# Patient Record
Sex: Male | Born: 1970 | Race: Black or African American | Hispanic: No | Marital: Married | Smoking: Current some day smoker
Health system: Southern US, Community
[De-identification: ages and names within clinical notes are randomized; demographics above are authoritative.]

## PROBLEM LIST (undated history)

## (undated) DIAGNOSIS — Z889 Allergy status to unspecified drugs, medicaments and biological substances status: Secondary | ICD-10-CM

## (undated) HISTORY — PX: NO PAST SURGERIES: SHX2092

---

## 2004-07-24 ENCOUNTER — Ambulatory Visit: Payer: Self-pay | Admitting: General Surgery

## 2007-08-24 ENCOUNTER — Emergency Department: Payer: Self-pay | Admitting: Emergency Medicine

## 2008-11-05 ENCOUNTER — Emergency Department: Payer: Self-pay | Admitting: Emergency Medicine

## 2011-11-16 ENCOUNTER — Ambulatory Visit: Payer: Self-pay | Admitting: Internal Medicine

## 2013-06-29 ENCOUNTER — Ambulatory Visit: Payer: Self-pay | Admitting: Emergency Medicine

## 2013-06-29 LAB — URINALYSIS, COMPLETE
Bacteria: NEGATIVE
Blood: NEGATIVE
Glucose,UR: NEGATIVE mg/dL (ref 0–75)
Ketone: NEGATIVE
Leukocyte Esterase: NEGATIVE
RBC,UR: NONE SEEN /HPF (ref 0–5)

## 2014-03-05 IMAGING — CR DG SHOULDER 3+V*R*
1 series · 3 of 3 positions shown · non-contrast
Comparison: none

REASON FOR EXAM: limited ROM
COMMENTS:

PROCEDURE:     MDR - MDR SHOULDER RIGHT COMPLETE  - November 16, 2011 [DATE]
RESULT:     Images of the right shoulder demonstrate good range of motion
with internal and external rotation without evidence of fracture or
dislocation.

[Series 1: internal rotate · 0.17mm/px · 3 of 3 slices shown]
[im 1/3]
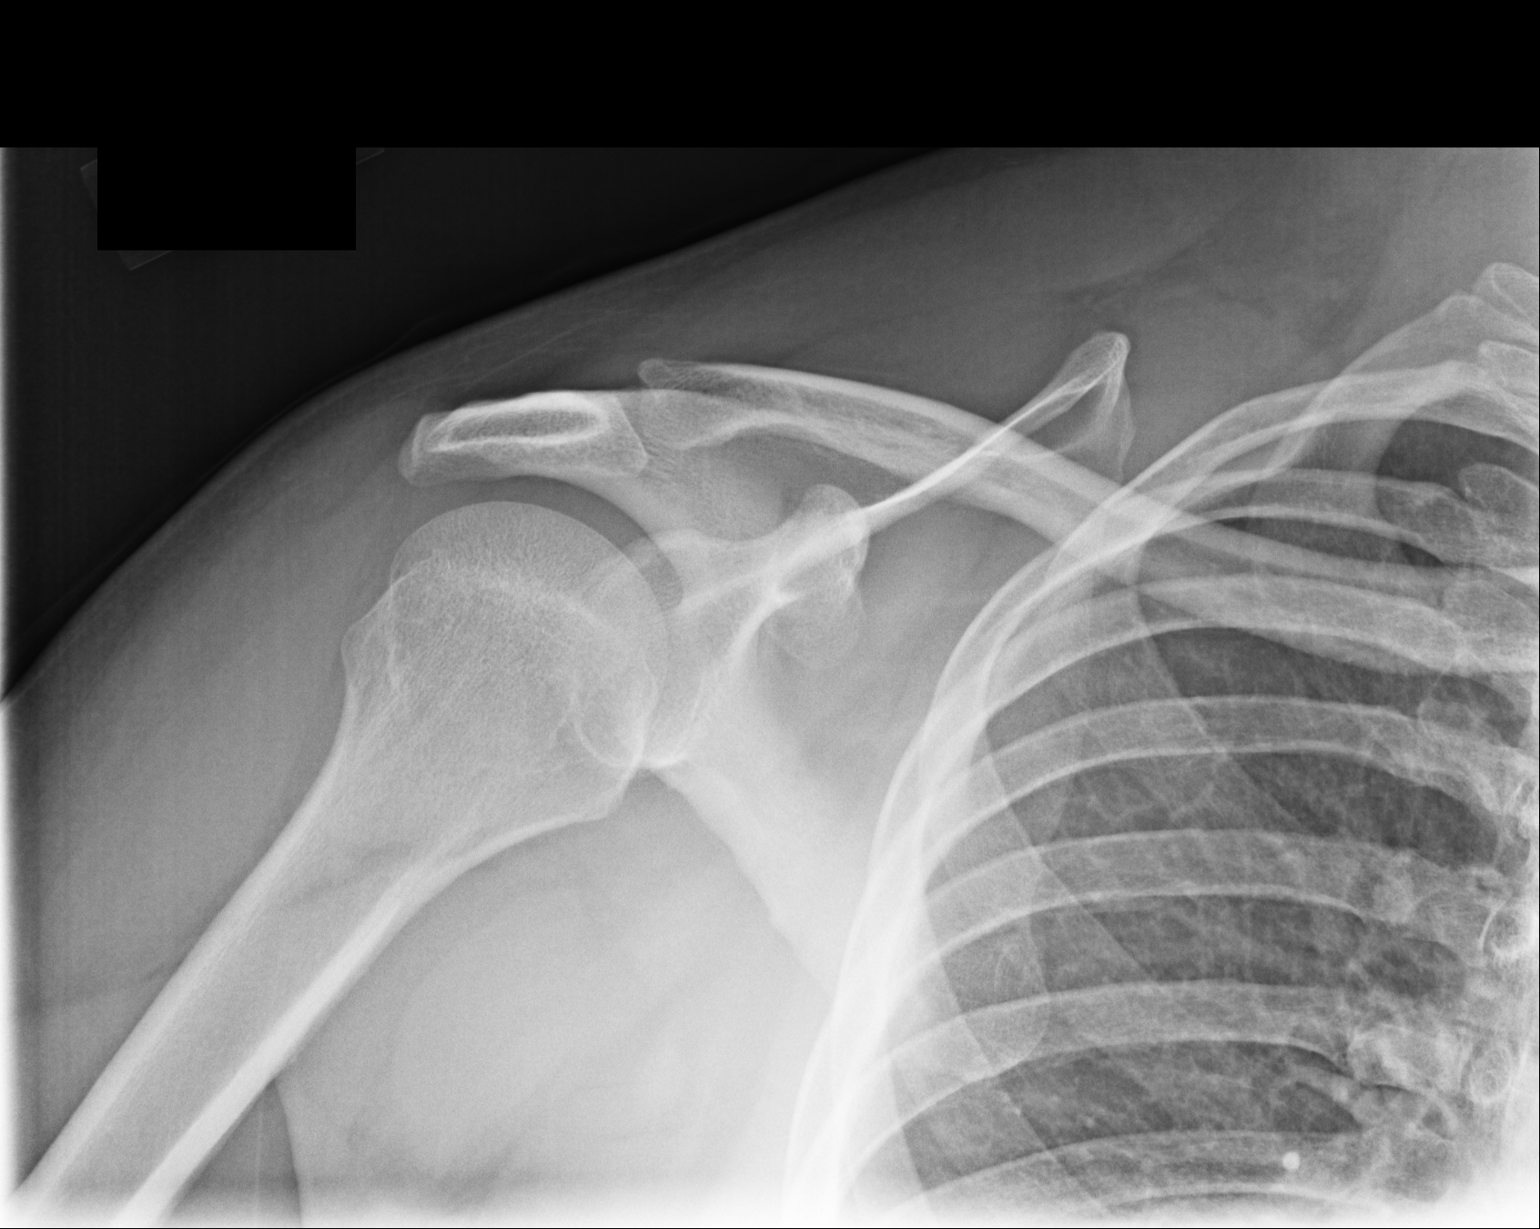
[im 2/3]
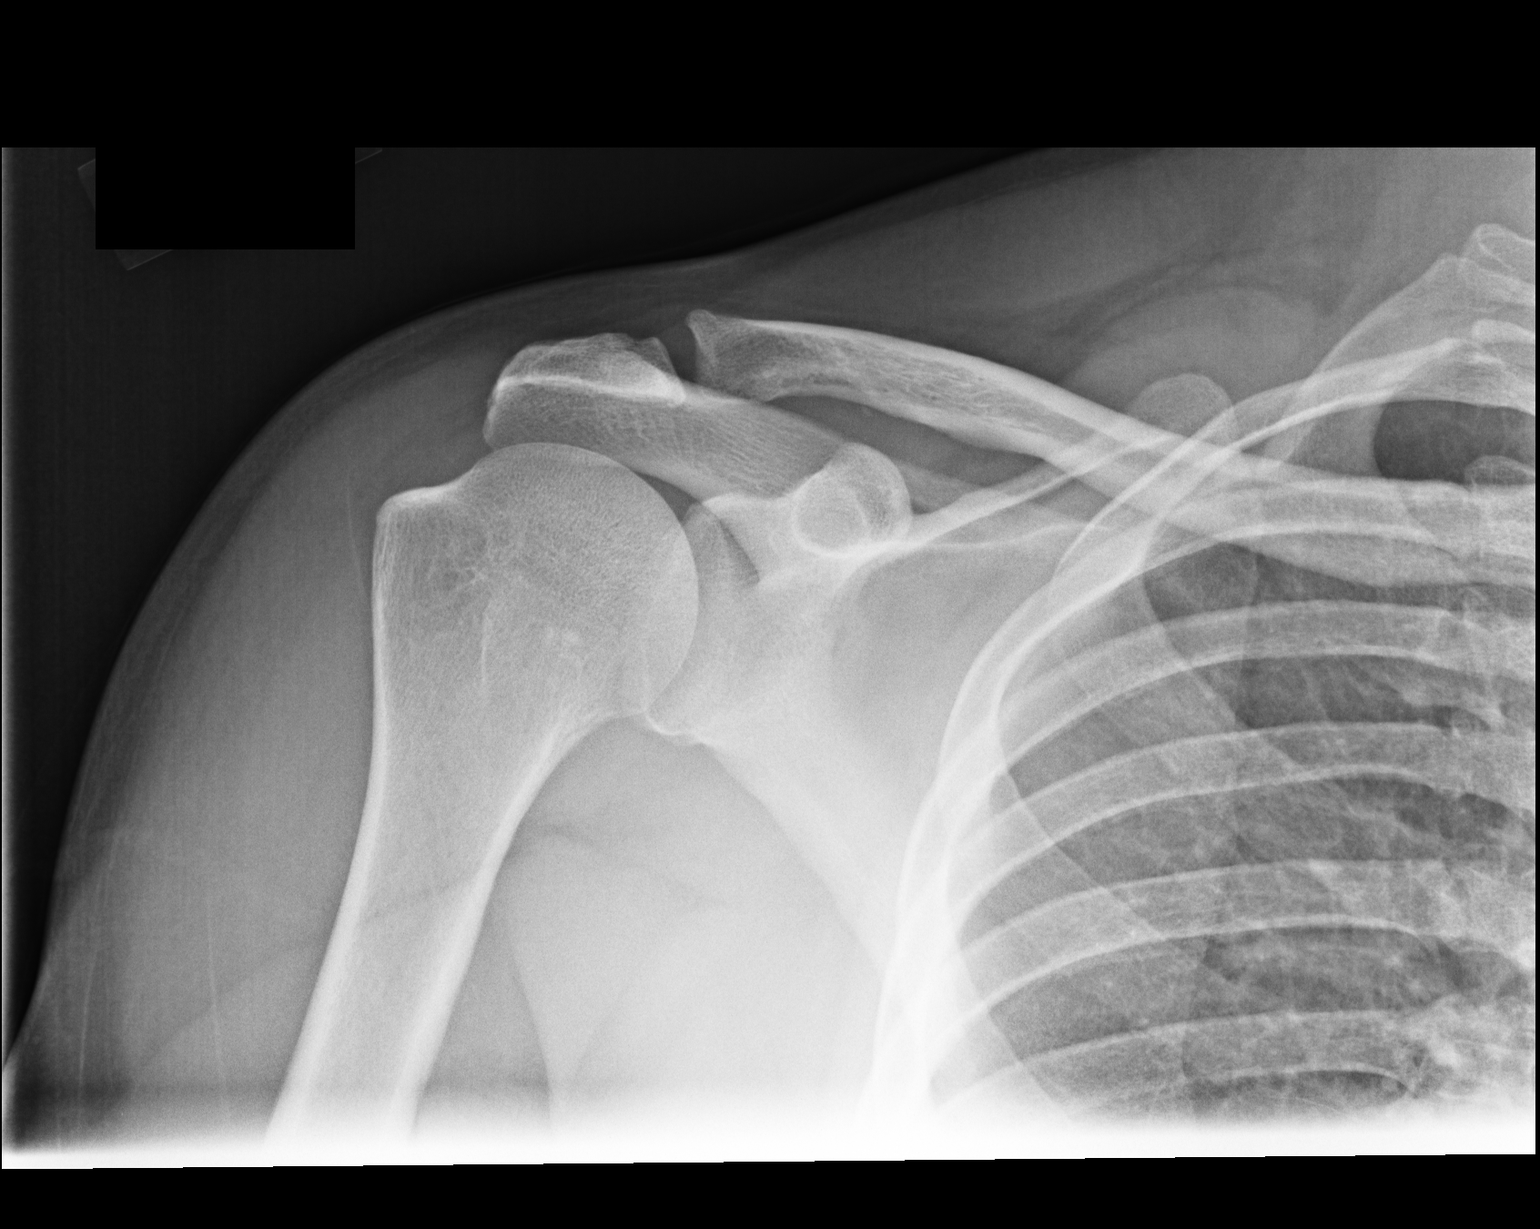
[im 3/3]
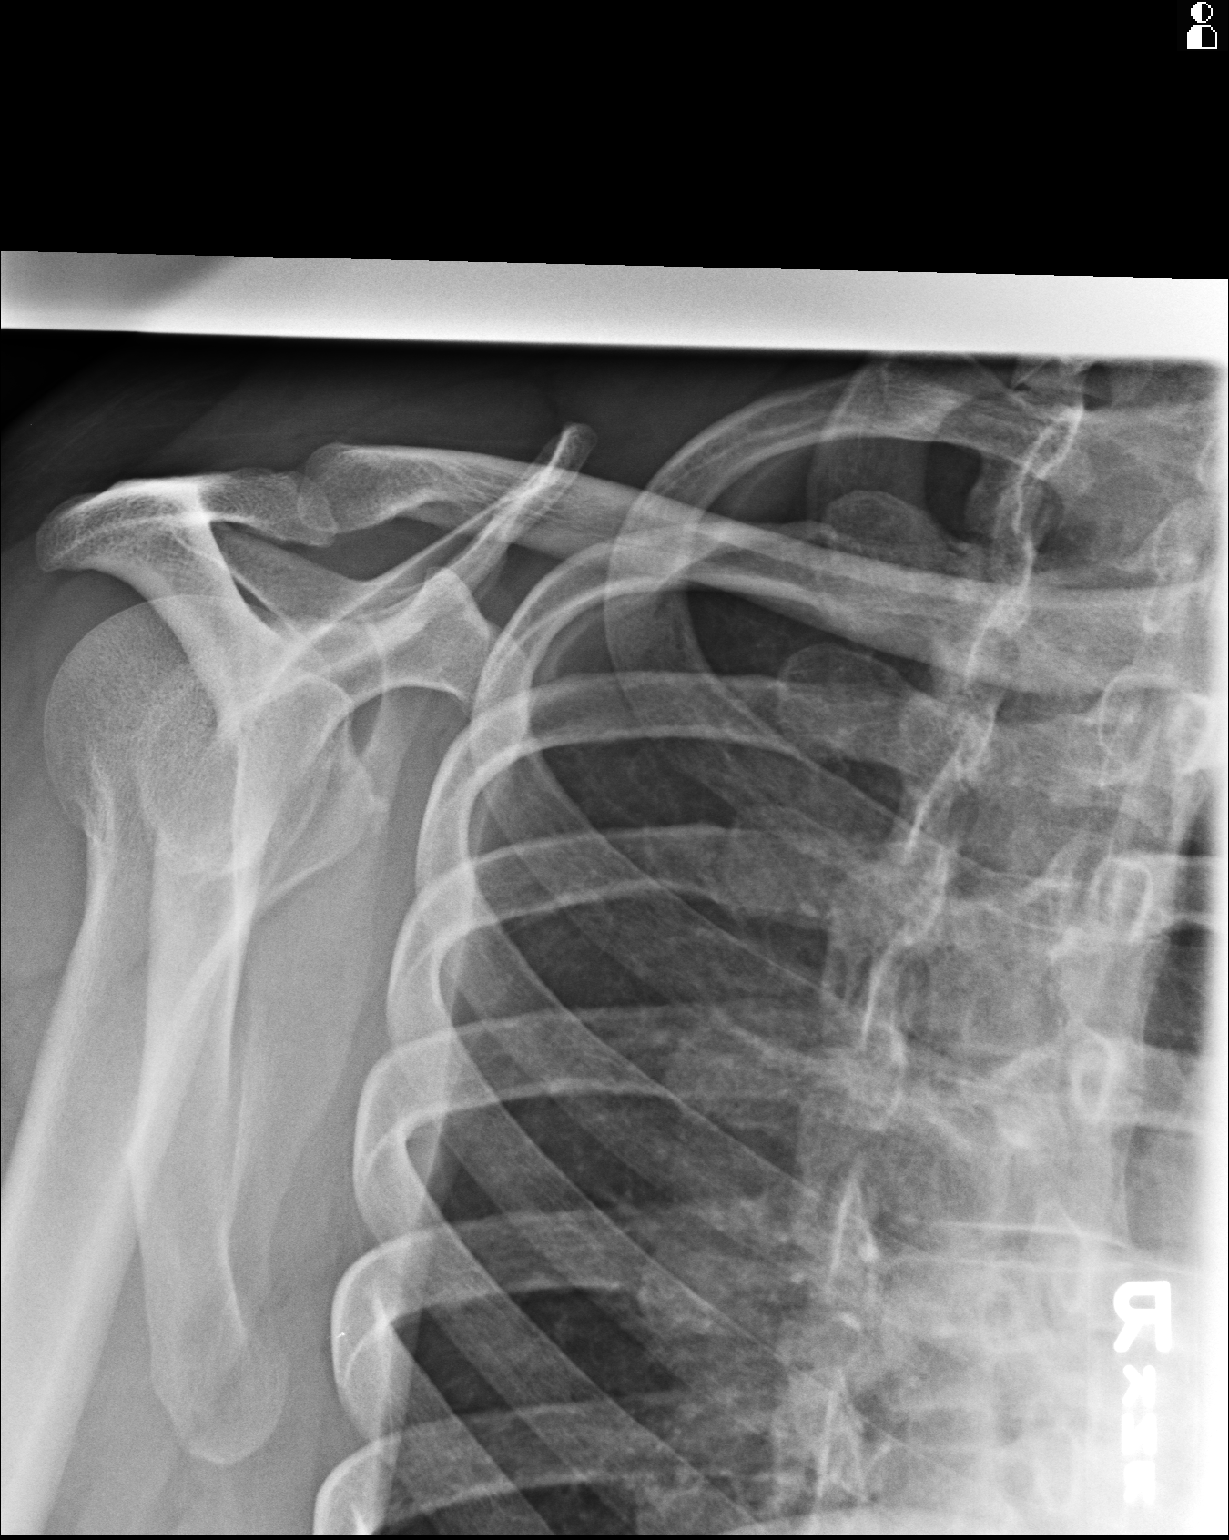

[3 of 3 positions shown; findings below may reference images not displayed]

IMPRESSION: 1.     No acute bony abnormality evident in the right shoulder.

## 2014-05-21 ENCOUNTER — Ambulatory Visit: Payer: Self-pay | Admitting: Emergency Medicine

## 2014-05-23 ENCOUNTER — Ambulatory Visit: Payer: Self-pay

## 2014-05-24 ENCOUNTER — Ambulatory Visit: Payer: Self-pay | Admitting: Emergency Medicine

## 2014-05-26 ENCOUNTER — Ambulatory Visit: Payer: Self-pay | Admitting: Physician Assistant

## 2016-09-01 ENCOUNTER — Ambulatory Visit
Admission: EM | Admit: 2016-09-01 | Discharge: 2016-09-01 | Disposition: A | Discharge status: Home / Self Care | Payer: 59 | Attending: Emergency Medicine | Admitting: Emergency Medicine

## 2016-09-01 ENCOUNTER — Encounter: Payer: Self-pay | Admitting: Emergency Medicine

## 2016-09-01 DIAGNOSIS — J029 Acute pharyngitis, unspecified: Secondary | ICD-10-CM | POA: Diagnosis not present

## 2016-09-01 MED ORDER — PREDNISONE 20 MG PO TABS: 40.0000 mg | ORAL_TABLET | Freq: Every day | ORAL | 0 refills | Status: AC

## 2016-09-01 MED ORDER — IPRATROPIUM BROMIDE 0.06 % NA SOLN: 2.0000 | Freq: Four times a day (QID) | NASAL | 0 refills | Status: DC

## 2016-09-01 MED ORDER — IBUPROFEN 800 MG PO TABS: 800.0000 mg | ORAL_TABLET | Freq: Three times a day (TID) | ORAL | 0 refills | Status: DC

## 2016-09-01 NOTE — ED Triage Notes (Signed)
Patient states he ate 2 hot dogs a couple of days ago and that night his throat started swelling.  It is stil swollen and sore

## 2016-09-01 NOTE — Discharge Instructions (Signed)
1 gram of Tylenol and the ibuprofen together 3 times a day as needed for pain.  Make sure you drink plenty of extra fluids.  Some people find salt water gargles and  Traditional Medic[employer]'s "Throat Coat" tea helpful. Take 5 mL of liquid Benadryl and 5 mL of Maalox. Mix it together, and then hold it in your mouth for as long as you can and then swallow. You may do this 4 times a day.  Start Mucinex D, stop the Zyrtec for the time being.  Go to www.goodrx.com to look up your medications. This will give you a list of where you can find your prescriptions at the most affordable prices.

## 2016-09-01 NOTE — ED Provider Notes (Signed)
HPI  SUBJECTIVE:  Patient reports sore throat starting 3-4 days ago starting approximately an hour after eating several hot dogs. He states pain is constant and then it feels "sore". He reports a cough at night. He states that his symptoms are worse at nighttime but is not associated with lying down, he took 20 mg prednisone which significantly helped his symptoms. He has also tried salt water gargles. He states that he had similar symptoms before after eating some tuna several years ago and no longer eats tuna for this reason.   no Fever  + Swollen neck glands    No nasal congestion, rhinorrhea, postnasal drip No Myalgias No Headache No Rash     No Recent Strep or mono Exposure No Abdominal Pain No reflux sxs No Allergy sxs although he has a history of allergies. States that they are not bothering him at this time  No Breathing difficulty, voice changes or sensation of throat swelling shut  No Drooling No Trismus No abx in past month. .  No antipyretic in past 4-6 hrs  Past medical history of seasonal allergies. No history of GERD, mono, strep, diabetes, hypertension, food allergies. [facility]   History reviewed. No pertinent past medical history.  History reviewed. No pertinent surgical history.  Family History  Problem Relation Age of Onset  . Hypertension Mother   . Cancer Father     Social History  Substance Use Topics  . Smoking status: Never Smoker  . Smokeless tobacco: Never Used  . Alcohol use Yes    No current facility-administered medications for this encounter.   Current Outpatient Prescriptions:  .  cetirizine (ZYRTEC) 10 MG tablet, Take 10 mg by mouth daily., Disp: , Rfl:   Allergies  Allergen Reactions  . Tuna [Fish Allergy] Swelling     ROS  As noted in HPI.   Physical Exam  BP 133/76 (BP Location: Left Arm)   Pulse 66   Temp 97.5 F (36.4 C)   Resp 18   Ht 6\' 1"  (1.854 m)   Wt 239 lb 14.4 oz (108.8 kg)   SpO2 98%   BMI  31.65 kg/m   Constitutional: Well developed, well nourished, no acute distress Eyes:  EOMI, conjunctiva normal bilaterally HENT: Normocephalic, atraumatic,mucus membranes moist. +  nasal congestion + erythematous oropharynx - enlarged tonsils - exudates. Uvula midline. Positive postnasal drip Respiratory: Normal inspiratory effort Cardiovascular: Normal rate, no murmurs, rubs, gallops GI: nondistended, nontender. No appreciable splenomegaly skin: No rash, skin intact Lymph:  + shotty cervical LN  Musculoskeletal: no deformities Neurologic: Alert & oriented x 3, no focal neuro deficits Psychiatric: Speech and behavior appropriate. At baseline mental status per caregiver.   ED Course   Medications - No data to display  No orders of the defined types were placed in this encounter.   No results found for this or any previous visit (from the past 24 hour(s)). No results found.  ED Clinical Impression  Sore throat  ED Assessment/Plan  Seriously doubt strep throat, so a rapid strep was not done. Presentation most consistent with viral pharyngitis versus acid reflux versus a food allergy. There is no evidence of airway compromise. Doubt retropharyngeal abscess or peritonsillar abscess.  Plan to send home with saline nasal irrigation, and Atrovent nasal spray, Mucinex D, Benadryl/Maalox mixture ibuprofen 800 mg with 1 g of Tylenol 3 times a day. Since the prednisone helped his symptoms significantly, we'll send home with 40 mg prednisone for the next 5 days.  Follow Up with PMD as needed.  Discussed MDM, plan and followup with patient . Discussed sn/sx that should prompt return to the  ED. Patient  agrees with plan.   Meds ordered this encounter  Medications  . cetirizine (ZYRTEC) 10 MG tablet    Sig: Take 10 mg by mouth daily.     *This clinic note was created using Dragon dictation software. Therefore, there may be occasional mistakes despite careful proofreading.    Domenick GongAshley  Amelya Mabry, MD 09/01/16 [phone]

## 2016-09-01 NOTE — ED Provider Notes (Signed)
HPI  SUBJECTIVE:  Patient reports sore throat starting 3-4 days ago starting approximately an hour after eating several hot dogs. He states pain is constant and then it feels "sore". He reports a cough at night. He states that his symptoms are worse at nighttime but is not associated with lying down, he took 20 mg prednisone which significantly helped his symptoms. He has also tried salt water gargles. He states that he had similar symptoms before after eating some tuna several years ago and no longer eats tuna for this reason.   no Fever  + Swollen neck glands                         No nasal congestion, rhinorrhea, postnasal drip No Myalgias No Headache No Rash                           No Recent Strep or mono Exposure No Abdominal Pain No reflux sxs No Allergy sxs although he has a history of allergies. States that they are not bothering him at this time  No Breathing difficulty, voice changes or sensation of throat swelling shut  No Drooling No Trismus No abx in past month. .  No antipyretic in past 4-6 hrs  Past medical history of seasonal allergies. No history of GERD, mono, stroke, diabetes, hypertension, food allergies. [facility]   History reviewed. No pertinent past medical history.  History reviewed. No pertinent surgical history.       Family History  Problem Relation Age of Onset  . Hypertension Mother   . Cancer Father         Social History  Substance Use Topics  . Smoking status: Never Smoker  . Smokeless tobacco: Never Used  . Alcohol use Yes    No current facility-administered medications for this encounter.   Current Outpatient Prescriptions:  .  cetirizine (ZYRTEC) 10 MG tablet, Take 10 mg by mouth daily., Disp: , Rfl:       Allergies  Allergen Reactions  . Tuna [Fish Allergy] Swelling     ROS  As noted in HPI.   Physical Exam  BP 133/76 (BP Location: Left Arm)   Pulse 66   Temp 97.5 F (36.4 C)    Resp 18   Ht 6\' 1"  (1.854 m)   Wt 239 lb 14.4 oz (108.8 kg)   SpO2 98%   BMI 31.65 kg/m   Constitutional: Well developed, well nourished, no acute distress Eyes:  EOMI, conjunctiva normal bilaterally HENT: Normocephalic, atraumatic,mucus membranes moist. +  nasal congestion + erythematous oropharynx - enlarged tonsils - exudates. Uvula midline. Positive postnasal drip Respiratory: Normal inspiratory effort Cardiovascular: Normal rate, no murmurs, rubs, gallops GI: nondistended, nontender. No appreciable splenomegaly skin: No rash, skin intact Lymph:  + shotty cervical LN  Musculoskeletal: no deformities Neurologic: Alert & oriented x 3, no focal neuro deficits Psychiatric: Speech and behavior appropriate. At baseline mental status per caregiver.   ED Course   Medications - No data to display  No orders of the defined types were placed in this encounter.   Lab Results Last 24 Hours  No results found for this or any previous visit (from the past 24 hour(s)).   Imaging Results (Last 48 hours)  No results found.    ED Clinical Impression  Sore throat  ED Assessment/Plan  Centor score 1/4 due to the shotty lymphadenopathy. Seriously doubt strep throat, so  a rapid strep was not done. Presentation most consistent with viral pharyngitis versus acid reflux versus a food allergy. There is no evidence of airway compromise. Doubt retropharyngeal abscess or peritonsillar abscess.  Plan to send home with saline nasal irrigation, and Atrovent nasal spray, d/c zyrtec start Mucinex D, Benadryl/Maalox mixture ibuprofen 800 mg with 1 g of Tylenol 3 times a day. Since the prednisone helped his symptoms significantly, we'll send home with 40 mg prednisone for the next 5 days. Follow Up with PMD as needed.  Discussed MDM, plan and followup with patient . Discussed sn/sx that should prompt return to the  ED. Patient  agrees with plan.       Meds ordered this encounter   Medications  . cetirizine (ZYRTEC) 10 MG tablet    Sig: Take 10 mg by mouth daily.     *This clinic note was created using Dragon dictation software. Therefore, there may be occasional mistakes despite careful proofreading.      Domenick GongAshley Kaiesha Tonner, MD 09/01/16 [phone]

## 2017-08-19 ENCOUNTER — Ambulatory Visit
Admission: EM | Admit: 2017-08-19 | Discharge: 2017-08-19 | Disposition: A | Discharge status: Home / Self Care | Payer: 59 | Attending: Family Medicine | Admitting: Family Medicine

## 2017-08-19 ENCOUNTER — Other Ambulatory Visit: Payer: Self-pay

## 2017-08-19 DIAGNOSIS — B358 Other dermatophytoses: Secondary | ICD-10-CM | POA: Diagnosis not present

## 2017-08-19 HISTORY — DX: Allergy status to unspecified drugs, medicaments and biological substances: Z88.9

## 2017-08-19 MED ORDER — MICONAZOLE NITRATE 2 % EX CREA: 1.0000 "application " | TOPICAL_CREAM | Freq: Two times a day (BID) | CUTANEOUS | 0 refills | Status: AC

## 2017-08-19 NOTE — Discharge Instructions (Signed)
Keep area dry and clean.

## 2017-08-19 NOTE — ED Provider Notes (Signed)
[facility]    CSN: [id number] Arrival date & time: 08/19/17  [id number]     History   Chief Complaint Chief Complaint  Patient presents with  . Skin Problem    HPI Luis Cabrera is a 46 y.o. male.   The history is provided by the patient.  Rash  Location:  Face and head/neck (round , patchy, scaly, erythematous, raised borders and pruritic rash to left cheek and back of head.) Head/neck rash location:  Scalp Facial rash location:  L cheek Severity:  Moderate Duration:  4 weeks Timing:  Constant Progression:  Worsening Chronicity:  New Relieved by:  Nothing   Past Medical History:  Diagnosis Date  . H/O seasonal allergies     There are no active problems to display for this patient.   History reviewed. No pertinent surgical history.     Home Medications    Prior to Admission medications   Medication Sig Start Date End Date Taking? Authorizing Provider  ibuprofen (ADVIL,MOTRIN) 800 MG tablet Take 1 tablet (800 mg total) by mouth 3 (three) times daily. 09/01/16   Domenick GongMortenson, Ashley, MD  ipratropium (ATROVENT) 0.06 % nasal spray Place 2 sprays into both nostrils 4 (four) times daily. 3-4 times/ day 09/01/16   Domenick GongMortenson, Ashley, MD  miconazole (MICOTIN) 2 % cream Apply 1 application topically 2 (two) times daily for 28 days. 08/19/17 09/16/17  Chasty Randal, NP    Family History Family History  Problem Relation Age of Onset  . Hypertension Mother   . Cancer Father     Social History Social History   Tobacco Use  . Smoking status: Never Smoker  . Smokeless tobacco: Never Used  Substance Use Topics  . Alcohol use: Yes    Comment: social  . Drug use: No     Allergies   [place] Binguna [fish allergy]   Review of Systems Review of Systems  Constitutional: Negative.   HENT: Negative.   Cardiovascular: Negative.   Skin: Positive for rash.       3-4 round, itchy rash spots on left cheek and back of head      Physical Exam Triage Vital  Signs ED Triage Vitals  Enc Vitals Group     BP 08/19/17 0902 126/73     Pulse Rate 08/19/17 0902 77     Resp 08/19/17 0902 18     Temp 08/19/17 0902 98.2 F (36.8 C)     Temp Source 08/19/17 0902 Oral     SpO2 08/19/17 0902 100 %     Weight 08/19/17 0901 235 lb (106.6 kg)     Height 08/19/17 0901 6\' 1"  (1.854 m)     Head Circumference --      Peak Flow --      Pain Score --      Pain Loc --      Pain Edu? --      Excl. in GC? --    No data found.  Updated Vital Signs BP 126/73 (BP Location: Left Arm)   Pulse 77   Temp 98.2 F (36.8 C) (Oral)   Resp 18   Ht 6\' 1"  (1.854 m)   Wt 235 lb (106.6 kg)   SpO2 100%   BMI 31.00 kg/m   Visual Acuity Right Eye Distance:   Left Eye Distance:   Bilateral Distance:    Right Eye Near:   Left Eye Near:    Bilateral Near:     Physical Exam  Constitutional: He is  oriented to person, place, and time. He appears well-developed and well-nourished.  HENT:  Head: Normocephalic.  Eyes: Pupils are equal, round, and reactive to light.  Cardiovascular: Normal rate, regular rhythm and normal heart sounds.  Pulmonary/Chest: Effort normal.  Neurological: He is alert and oriented to person, place, and time.  Skin: Skin is warm. Rash (round , patchy, scaly, erythematous with raised borders and pruritic rash to left cheek and back of head. 1 to left cheek and 3 to back of head ) noted.     UC Treatments / Results  Labs (all labs ordered are listed, but only abnormal results are displayed) Labs Reviewed - No data to display  EKG  EKG Interpretation None       Radiology No results found.  Procedures Procedures (including critical care time)  Medications Ordered in UC Medications - No data to display   Initial Impression / Assessment and Plan / UC Course  I have reviewed the triage vital signs and the nursing notes.  Pertinent labs & imaging results that were available during my care of the patient were reviewed by me and  considered in my medical decision making (see chart for details).      Final Clinical Impressions(s) / UC Diagnoses   Final diagnoses:  Tinea faciale    ED Discharge Orders        Ordered    miconazole (MICOTIN) 2 % cream  2 times daily     08/19/17 0935       Controlled Substance Prescriptions [place] Controlled Substance Registry consulted? Not Applicable   Reinaldo RaddleMultani, Everlie Eble, NP 08/19/17 [phone]

## 2017-08-19 NOTE — ED Triage Notes (Addendum)
Pt with one area on his face and three on his scalp which he thinks is ringworm. Noticed them a few weeks ago.

## 2020-02-29 ENCOUNTER — Ambulatory Visit
Admission: EM | Admit: 2020-02-29 | Discharge: 2020-02-29 | Disposition: A | Discharge status: Home / Self Care | Payer: 59 | Attending: Physician Assistant | Admitting: Physician Assistant

## 2020-02-29 ENCOUNTER — Other Ambulatory Visit: Payer: Self-pay

## 2020-02-29 DIAGNOSIS — J069 Acute upper respiratory infection, unspecified: Secondary | ICD-10-CM

## 2020-02-29 MED ORDER — METHYLPREDNISOLONE 4 MG PO TBPK: ORAL_TABLET | ORAL | 0 refills | Status: DC

## 2020-02-29 MED ORDER — ALBUTEROL SULFATE HFA 108 (90 BASE) MCG/ACT IN AERS: 1.0000 | INHALATION_SPRAY | Freq: Four times a day (QID) | RESPIRATORY_TRACT | 0 refills | Status: DC | PRN

## 2020-02-29 NOTE — ED Triage Notes (Signed)
Patient complains of cough that started on Tuesday. Patient reports that he has had 2 negative covid tests on Tuesday and Yesterday. Reports that his only symptoms now are a cough and diarrhea.

## 2020-02-29 NOTE — Discharge Instructions (Addendum)
-  Medrol dose pack: Take as directed on packaging.  6 tablets first day, 5 tablets second day, 3 tablets on the third day... -Albuterol 1 to 2 puffs every 6 hours as needed for wheezing, shortness of breath or cough -Can use over-the-counter allergy medication daily for congestion and runny nose. -Rest and plenty fluids -Plan diet as attached until diarrhea improves -Follow-up with clinic or primary care if symptoms worsen or not improve

## 2020-02-29 NOTE — ED Provider Notes (Signed)
[facility]    CSN: [id number] Arrival date & time: 02/29/20  4235      History   Chief Complaint Chief Complaint  Patient presents with  . Cough    HPI Luis Cabrera is a 49 y.o. male.   Patient is a 89 yom who presents with complaint of cough that started on Tuesday. Patient reports that he has had 2 negative COVID tests on Tuesday and yesterday.  Patient states he started feeling bad on Tuesday and was feeling worse Wednesday.  He reports having a fever Wednesday and Thursday but nothing today.  Symptoms include runny nose, congestion, cough, and watery diarrhea.  Patient denies any body aches or shortness of breath.  Patient reports she has been taking Motrin as well as honey.  He does report history of seasonal allergies usually in the summers.  He also reports some sore throat due to his cough.  He reports that his only symptoms now are a cough and diarrhea.      Past Medical History:  Diagnosis Date  . H/O seasonal allergies     There are no problems to display for this patient.   Past Surgical History:  Procedure Laterality Date  . NO PAST SURGERIES         Home Medications    Prior to Admission medications   Medication Sig Start Date End Date Taking? Authorizing Provider  albuterol (VENTOLIN HFA) 108 (90 Base) MCG/ACT inhaler Inhale 1-2 puffs into the lungs every 6 (six) hours as needed for wheezing or shortness of breath (cough). 02/29/20   Candis Schatz, PA-C  ipratropium (ATROVENT) 0.06 % nasal spray Place 2 sprays into both nostrils 4 (four) times daily. 3-4 times/ day 09/01/16   Domenick Gong, MD  methylPREDNISolone (MEDROL DOSEPAK) 4 MG TBPK tablet Take as directed on packaging: 6 tabs first day, 5 tabs second day, ... 02/29/20   Candis Schatz, PA-C    Family History Family History  Problem Relation Age of Onset  . Hypertension Mother   . Cancer Father     Social History Social History   Tobacco Use  . Smoking  status: Never Smoker  . Smokeless tobacco: Never Used  Vaping Use  . Vaping Use: Never used  Substance Use Topics  . Alcohol use: Yes    Comment: social  . Drug use: No     Allergies   [place] Gum [fish allergy]   Review of Systems Review of Systems as noted above in HPI, other systems reviewed and found to be negative.   Physical Exam Triage Vital Signs Today's Vitals   02/29/20 0837 02/29/20 0840  BP:  118/70  Pulse:  85  Resp:  18  Temp:  98.1 F (36.7 C)  TempSrc:  Oral  SpO2:  98%  Weight: 245 lb (111.1 kg)   Height: 6\' 2"  (1.88 m)   PainSc: 5     Body mass index is 31.46 kg/m.  No data found.  Updated Vital Signs BP 118/70 (BP Location: Right Arm)   Pulse 85   Temp 98.1 F (36.7 C) (Oral)   Resp 18   Ht 6\' 2"  (1.88 m)   Wt 245 lb (111.1 kg)   SpO2 98%   BMI 31.46 kg/m    Physical Exam Constitutional:      Appearance: Normal appearance.  HENT:     Right Ear: Tympanic membrane and ear canal normal.     Left Ear: Tympanic membrane and ear canal normal.  Nose: Congestion and rhinorrhea present.     Mouth/Throat:     Mouth: Mucous membranes are moist.     Pharynx: Oropharynx is clear.  Eyes:     Pupils: Pupils are equal, round, and reactive to light.  Cardiovascular:     Rate and Rhythm: Normal rate and regular rhythm.     Pulses: Normal pulses.     Heart sounds: Normal heart sounds.  Pulmonary:     Effort: Pulmonary effort is normal.     Breath sounds: Normal breath sounds.     Comments: Frequent cough.  Minimal wheeze with forced expiration Skin:    General: Skin is warm.     Capillary Refill: Capillary refill takes less than 2 seconds.  Neurological:     General: No focal deficit present.     Mental Status: He is alert and oriented to person, place, and time.      UC Treatments / Results  Labs (all labs ordered are listed, but only abnormal results are displayed) Labs Reviewed - No data to display  EKG   Radiology No results  found.  Procedures Procedures (including critical care time)  Medications Ordered in UC Medications - No data to display  Initial Impression / Assessment and Plan / UC Course  I have reviewed the triage vital signs and the nursing notes.  Pertinent labs & imaging results that were available during my care of the patient were reviewed by me and considered in my medical decision making (see chart for details).     Patient with cough, congestion, runny nose, and diarrhea.  Slight wheeze with forced expiration.  Febrile Wednesday and Thursday but not today.  Give prescription for Medrol Dosepak and albuterol.  Recommend brat diet until diarrhea better. Final Clinical Impressions(s) / UC Diagnoses   Final diagnoses:  Viral URI with cough     Discharge Instructions     -Medrol dose pack: Take as directed on packaging.  6 tablets first day, 5 tablets second day, 3 tablets on the third day... -Albuterol 1 to 2 puffs every 6 hours as needed for wheezing, shortness of breath or cough -Can use over-the-counter allergy medication daily for congestion and runny nose. -Rest and plenty fluids -Plan diet as attached until diarrhea improves -Follow-up with clinic or primary care if symptoms worsen or not improve    ED Prescriptions    Medication Sig Dispense Auth. Provider   methylPREDNISolone (MEDROL DOSEPAK) 4 MG TBPK tablet Take as directed on packaging: 6 tabs first day, 5 tabs second day, ... 1 each Luvenia Redden, PA-C   albuterol (VENTOLIN HFA) 108 (90 Base) MCG/ACT inhaler Inhale 1-2 puffs into the lungs every 6 (six) hours as needed for wheezing or shortness of breath (cough). 6.7 g Luvenia Redden, PA-C     PDMP not reviewed this encounter.   Luvenia Redden, PA-C 02/29/20 [phone]

## 2021-04-22 ENCOUNTER — Other Ambulatory Visit: Payer: Self-pay

## 2021-04-22 ENCOUNTER — Ambulatory Visit
Admission: EM | Admit: 2021-04-22 | Discharge: 2021-04-22 | Disposition: A | Discharge status: Home / Self Care | Payer: 59 | Attending: Physician Assistant | Admitting: Physician Assistant

## 2021-04-22 ENCOUNTER — Encounter: Payer: Self-pay | Admitting: Emergency Medicine

## 2021-04-22 DIAGNOSIS — B350 Tinea barbae and tinea capitis: Secondary | ICD-10-CM | POA: Diagnosis not present

## 2021-04-22 MED ORDER — TERBINAFINE HCL 250 MG PO TABS: 250.0000 mg | ORAL_TABLET | Freq: Every day | ORAL | 0 refills | Status: AC

## 2021-04-22 NOTE — ED Provider Notes (Signed)
[facility]    CSN: [id number] Arrival date & time: 04/22/21  1716      History   Chief Complaint Chief Complaint  Patient presents with   Rash    HPI Luis Cabrera is a 50 y.o. male presenting for approximately 59-month history of circular rash in multiple places on his scalp and behind his ears.  He has hair loss in these areas.  He says it does itch.  He has a history of fungal infections and believes that he has ringworm.  Denies any contacts with similar symptoms.  He states that he works outside and he sweats a lot and is unsure if that has any thing to do with it.  No other complaints.  HPI  Past Medical History:  Diagnosis Date   H/O seasonal allergies     There are no problems to display for this patient.   Past Surgical History:  Procedure Laterality Date   NO PAST SURGERIES         Home Medications    Prior to Admission medications   Medication Sig Start Date End Date Taking? Authorizing Provider  terbinafine (LAMISIL) 250 MG tablet Take 1 tablet (250 mg total) by mouth daily for 28 days. 04/22/21 05/20/21 Yes Shirlee Latch, PA-C  albuterol (VENTOLIN HFA) 108 (90 Base) MCG/ACT inhaler Inhale 1-2 puffs into the lungs every 6 (six) hours as needed for wheezing or shortness of breath (cough). Patient not taking: Reported on 04/22/2021 02/29/20   Candis Schatz, PA-C  ipratropium (ATROVENT) 0.06 % nasal spray Place 2 sprays into both nostrils 4 (four) times daily. 3-4 times/ day Patient not taking: Reported on 04/22/2021 09/01/16   Domenick Gong, MD  methylPREDNISolone (MEDROL DOSEPAK) 4 MG TBPK tablet Take as directed on packaging: 6 tabs first day, 5 tabs second day, ... Patient not taking: Reported on 04/22/2021 02/29/20   Candis Schatz, PA-C    Family History Family History  Problem Relation Age of Onset   Hypertension Mother    Cancer Father     Social History Social History   Tobacco Use   Smoking status: Never    Smokeless tobacco: Never  Vaping Use   Vaping Use: Never used  Substance Use Topics   Alcohol use: Yes    Comment: social   Drug use: No     Allergies   Tuna [fish allergy]   Review of Systems Review of Systems  Constitutional:  Negative for fatigue and fever.  Skin:  Positive for rash.  Neurological:  Negative for headaches.  Hematological:  Negative for adenopathy.    Physical Exam Triage Vital Signs ED Triage Vitals  Enc Vitals Group     BP 04/22/21 1729 137/81     Pulse Rate 04/22/21 1729 84     Resp 04/22/21 1729 18     Temp 04/22/21 1729 98.7 F (37.1 C)     Temp Source 04/22/21 1729 Oral     SpO2 04/22/21 1729 99 %     Weight --      Height --      Head Circumference --      Peak Flow --      Pain Score 04/22/21 1726 0     Pain Loc --      Pain Edu? --      Excl. in GC? --    No data found.  Updated Vital Signs BP 137/81 (BP Location: Left Arm)   Pulse 84  Temp 98.7 F (37.1 C) (Oral)   Resp 18   SpO2 99%      Physical Exam Vitals and nursing note reviewed.  Constitutional:      General: He is not in acute distress.    Appearance: Normal appearance. He is well-developed. He is not ill-appearing.  HENT:     Head: Normocephalic and atraumatic.     Comments: There are multiple (~6) circular patches of hypopigmented skin with hair loss of scalp and posterior to ears Eyes:     General: No scleral icterus.    Conjunctiva/sclera: Conjunctivae normal.  Cardiovascular:     Rate and Rhythm: Normal rate and regular rhythm.  Pulmonary:     Effort: Pulmonary effort is normal. No respiratory distress.  Musculoskeletal:     Cervical back: Neck supple.  Skin:    General: Skin is warm and dry.  Neurological:     General: No focal deficit present.     Mental Status: He is alert. Mental status is at baseline.     Motor: No weakness.     Gait: Gait normal.  Psychiatric:        Mood and Affect: Mood normal.        Behavior: Behavior normal.         Thought Content: Thought content normal.     UC Treatments / Results  Labs (all labs ordered are listed, but only abnormal results are displayed) Labs Reviewed - No data to display  EKG   Radiology No results found.  Procedures Procedures (including critical care time)  Medications Ordered in UC Medications - No data to display  Initial Impression / Assessment and Plan / UC Course  I have reviewed the triage vital signs and the nursing notes.  Pertinent labs & imaging results that were available during my care of the patient were reviewed by me and considered in my medical decision making (see chart for details).  50 year old male presenting for multiple areas of rash of his head with associated hair loss.  Clinical presentation consistent with tinea capitis.  Patient has been treated for this before and states that he took a pill.  Patient prescribed terbinafine today.  He has no history of liver conditions.  Advised good hygiene and follow-up with PCP if he needs more of this medication as he may need to have lab work performed.   Final Clinical Impressions(s) / UC Diagnoses   Final diagnoses:  Tinea capitis     Discharge Instructions      I have sent an antifungal pill for you to take.  You can discontinue use of this if your symptoms have resolved after 2 weeks.  If you are still having the rash then complete 4 weeks.  If you need more of this medication you may need to follow-up with your primary care provider or dermatology.  MANAGEMENT OF CLOSE CONTACTS Household members of an individual diagnosed with tinea capitis should be physically examined for signs of tinea capitis and should be treated simultaneously if tinea capitis is detected.  Asymptomatic carriers of dermatophytes may serve as reservoirs for recurrent infection. Because of this possibility, we suggest use of an antifungal shampoo by all household members for two to four weeks [68]. Household members  should begin to use the shampoo when the infected individual begins treatment for tinea capitis.  Children with tinea capitis should avoid sharing hair care tools and headwear (eg, helmets or hats) with other individuals. Pillowcases should not be shared. In  addition, participation in sports with head-to-head contact should be avoided [69]. Bedding and towels used by the infected individual should be washed. Furniture that is frequently in direct contact with the affected individual or pet should be washed, if possible.  Cats or dogs (especially kittens and puppies) or other animals (such as cows, [place] pigs, and other animals) may be reservoirs for infection. The animals can have clinical signs of dermatophyte infection with scaling and hair loss, but can also be asymptomatic carriers. If there is an outbreak of tinea in a household, the pet (especially if new) should be evaluated by a International aid/development worker. The animal should also be evaluated if the patient or other household members experience recurrent dermatophyte infections [70].  PROGNOSIS AND FOLLOW-UP The prognosis of tinea capitis is excellent, with complete clearance occurring in most patients after a course of treatment. Complete hair regrowth occurs in most children with hair loss. Patients with prolonged or severe infections (eg, kerion, favus) have the greatest risk for permanent alopecia. Still, the majority of lost hair often regrows.  There are rare reports of fungemia from dermatophyte infections in immunosuppressed patients. There is one report of culture-proven fungemia in an immunocompetent patient after surgical excision of a kerion due T. mentagrophytes [71].  Clinical follow-up to assess for clinical clearance should be performed at the end of therapy to assess for clinical cure. In patients who have experienced recurrent tinea capitis, we typically perform a fungal culture to confirm clearance after treatment. A fungal culture should  also be performed in patients who appear to fail treatment. (See 'Treatment failure' below.)  Continuation of twice-weekly antifungal shampoo may help decrease the rate of reinfection.  TREATMENT FAILURE Signs of ongoing infection include persistent erythema, scale, or drainage. Hair loss may remain after successful treatment of tinea capitis, particularly in patients with kerion or favus. A fungal culture should be performed when patients appear to fail treatment. (See 'Culture' above.)  If there is a poor response to treatment, adherence to the treatment regimen should be assessed. This includes review of the actual frequency of treatment and dose ingested, as well the method of drug administration. Griseofulvin is much more effectively absorbed when given with fatty foods, and terbinafine absorption is reduced when crushed or sprinkled into apple sauce or fruits. If the child has been unable to take the medication as prescribed, methods to improve adherence (eg, education or change in medication formulation) should be made to increase the likelihood of adequate treatment.  For patients initially treated with 20 mg/kg of griseofulvin per day and with good adherence to therapy, an inadequate response can be managed with an increase in dose to 25 mg/kg per day and extension of the treatment course to a total of 12 weeks. Alternatively, the patient can be switched to terbinafine. Increasing the dose and duration of griseofulvin is our preferred intervention for patients with M. canis infection. Occasionally, treatment for longer than 12 weeks is needed. If a patient has a poor response to initial treatment with terbinafine, then griseofulvin should be used, especially if the infection is with Microsporum species.  In addition to adherence to therapy, the possibility of reinfection from contact with an infected individual or pet, contaminated fomites, or an asymptomatic carrier of the dermatophyte should be  considered when patients fail treatment. (See 'Management of close contacts' above.)   ED Prescriptions     Medication Sig Dispense Auth. Provider   terbinafine (LAMISIL) 250 MG tablet Take 1 tablet (250 mg total) by mouth  daily for 28 days. 28 tablet Shirlee LatchEaves, Taquila Leys B, PA-C      PDMP not reviewed this encounter.   Shirlee Latchaves, Ladawn Boullion B, PA-C 04/22/21 1811

## 2021-04-22 NOTE — ED Triage Notes (Signed)
Rash on scalp, noticed 2 months ago, and has spread to both ears.  Patient thinks it is ringworm

## 2021-04-22 NOTE — Discharge Instructions (Addendum)
I have sent an antifungal pill for you to take.  You can discontinue use of this if your symptoms have resolved after 2 weeks.  If you are still having the rash then complete 4 weeks.  If you need more of this medication you may need to follow-up with your primary care provider or dermatology.  MANAGEMENT OF CLOSE CONTACTS Household members of an individual diagnosed with tinea capitis should be physically examined for signs of tinea capitis and should be treated simultaneously if tinea capitis is detected.  Asymptomatic carriers of dermatophytes may serve as reservoirs for recurrent infection. Because of this possibility, we suggest use of an antifungal shampoo by all household members for two to four weeks [68]. Household members should begin to use the shampoo when the infected individual begins treatment for tinea capitis.  Children with tinea capitis should avoid sharing hair care tools and headwear (eg, helmets or hats) with other individuals. Pillowcases should not be shared. In addition, participation in sports with head-to-head contact should be avoided [69]. Bedding and towels used by the infected individual should be washed. Furniture that is frequently in direct contact with the affected individual or pet should be washed, if possible.  Cats or dogs (especially kittens and puppies) or other animals (such as cows, [place] pigs, and other animals) may be reservoirs for infection. The animals can have clinical signs of dermatophyte infection with scaling and hair loss, but can also be asymptomatic carriers. If there is an outbreak of tinea in a household, the pet (especially if new) should be evaluated by a International aid/development worker. The animal should also be evaluated if the patient or other household members experience recurrent dermatophyte infections [70].  PROGNOSIS AND FOLLOW-UP The prognosis of tinea capitis is excellent, with complete clearance occurring in most patients after a course of treatment.  Complete hair regrowth occurs in most children with hair loss. Patients with prolonged or severe infections (eg, kerion, favus) have the greatest risk for permanent alopecia. Still, the majority of lost hair often regrows.  There are rare reports of fungemia from dermatophyte infections in immunosuppressed patients. There is one report of culture-proven fungemia in an immunocompetent patient after surgical excision of a kerion due T. mentagrophytes [71].  Clinical follow-up to assess for clinical clearance should be performed at the end of therapy to assess for clinical cure. In patients who have experienced recurrent tinea capitis, we typically perform a fungal culture to confirm clearance after treatment. A fungal culture should also be performed in patients who appear to fail treatment. (See 'Treatment failure' below.)  Continuation of twice-weekly antifungal shampoo may help decrease the rate of reinfection.  TREATMENT FAILURE Signs of ongoing infection include persistent erythema, scale, or drainage. Hair loss may remain after successful treatment of tinea capitis, particularly in patients with kerion or favus. A fungal culture should be performed when patients appear to fail treatment. (See 'Culture' above.)  If there is a poor response to treatment, adherence to the treatment regimen should be assessed. This includes review of the actual frequency of treatment and dose ingested, as well the method of drug administration. Griseofulvin is much more effectively absorbed when given with fatty foods, and terbinafine absorption is reduced when crushed or sprinkled into apple sauce or fruits. If the child has been unable to take the medication as prescribed, methods to improve adherence (eg, education or change in medication formulation) should be made to increase the likelihood of adequate treatment.  For patients initially treated with 20 mg/kg of  griseofulvin per day and with good adherence to  therapy, an inadequate response can be managed with an increase in dose to 25 mg/kg per day and extension of the treatment course to a total of 12 weeks. Alternatively, the patient can be switched to terbinafine. Increasing the dose and duration of griseofulvin is our preferred intervention for patients with M. canis infection. Occasionally, treatment for longer than 12 weeks is needed. If a patient has a poor response to initial treatment with terbinafine, then griseofulvin should be used, especially if the infection is with Microsporum species.  In addition to adherence to therapy, the possibility of reinfection from contact with an infected individual or pet, contaminated fomites, or an asymptomatic carrier of the dermatophyte should be considered when patients fail treatment. (See 'Management of close contacts' above.)

## 2022-03-13 ENCOUNTER — Ambulatory Visit
Admission: EM | Admit: 2022-03-13 | Discharge: 2022-03-13 | Disposition: A | Discharge status: Home / Self Care | Payer: 59 | Attending: Emergency Medicine | Admitting: Emergency Medicine

## 2022-03-13 ENCOUNTER — Encounter: Payer: Self-pay | Admitting: Emergency Medicine

## 2022-03-13 DIAGNOSIS — B350 Tinea barbae and tinea capitis: Secondary | ICD-10-CM | POA: Insufficient documentation

## 2022-03-13 LAB — COMPREHENSIVE METABOLIC PANEL
ALT: 18 U/L (ref 0–44)
AST: 24 U/L (ref 15–41)
Albumin: 4.2 g/dL (ref 3.5–5.0)
Alkaline Phosphatase: 64 U/L (ref 38–126)
Anion gap: 8 (ref 5–15)
BUN: 16 mg/dL (ref 6–20)
CO2: 25 mmol/L (ref 22–32)
Calcium: 9 mg/dL (ref 8.9–10.3)
Chloride: 106 mmol/L (ref 98–111)
Creatinine, Ser: 1.03 mg/dL (ref 0.61–1.24)
GFR, Estimated: >60 mL/min (ref >60)
Glucose, Bld: 102 mg/dL — ABNORMAL HIGH (ref 70–99)
Potassium: 4 mmol/L (ref 3.5–5.1)
Sodium: 139 mmol/L (ref 135–145)
Total Bilirubin: 0.3 mg/dL (ref 0.3–1.2)
Total Protein: 8.3 g/dL — ABNORMAL HIGH (ref 6.5–8.1)

## 2022-03-13 MED ORDER — FLUCONAZOLE 200 MG PO TABS: 400.0000 mg | ORAL_TABLET | Freq: Every day | ORAL | 0 refills | Status: AC

## 2022-03-13 NOTE — ED Triage Notes (Signed)
Patient c/o itchy red area on his face and top of his head that started about 2 months ago.

## 2022-03-13 NOTE — ED Provider Notes (Signed)
[facility]    CSN: [id number] Arrival date & time: 03/13/22  0850      History   Chief Complaint Chief Complaint  Patient presents with   Rash    HPI Luis Cabrera is a 51 y.o. male.   HPI  51 year old male here for evaluation of rash.  Patient reports that he has been experiencing an itchy rash on the left side of his scalp and in the midline on the left side of his face for at least the past 2 months.  He denies any drainage but he states that it does itch.  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  He also attends the gym.  He is unaware of being exposed to anyone with similar symptoms and he has not been around any small children and he does not have pets.  Past Medical History:  Diagnosis Date   H/O seasonal allergies     There are no problems to display for this patient.   Past Surgical History:  Procedure Laterality Date   NO PAST SURGERIES         Home Medications    Prior to Admission medications   Medication Sig Start Date End Date Taking? Authorizing Provider  albuterol (VENTOLIN HFA) 108 (90 Base) MCG/ACT inhaler Inhale 1-2 puffs into the lungs every 6 (six) hours as needed for wheezing or shortness of breath (cough). Patient not taking: Reported on 04/22/2021 02/29/20   Candis Schatz, PA-C  ipratropium (ATROVENT) 0.06 % nasal spray Place 2 sprays into both nostrils 4 (four) times daily. 3-4 times/ day Patient not taking: Reported on 04/22/2021 09/01/16   Domenick Gong, MD  methylPREDNISolone (MEDROL DOSEPAK) 4 MG TBPK tablet Take as directed on packaging: 6 tabs first day, 5 tabs second day, ... Patient not taking: Reported on 04/22/2021 02/29/20   Candis Schatz, PA-C    Family History Family History  Problem Relation Age of Onset   Hypertension Mother    Cancer Father     Social History Social History   Tobacco Use   Smoking status: Never   Smokeless tobacco: Never   Vaping Use   Vaping Use: Never used  Substance Use Topics   Alcohol use: Yes    Comment: social   Drug use: No     Allergies   [place] allergy]   Review of Systems Review of Systems  Constitutional:  Negative for fever.  Skin:  Positive for rash.  Hematological: Negative.   Psychiatric/Behavioral: Negative.       Physical Exam Triage Vital Signs ED Triage Vitals  Enc Vitals Group     BP 03/13/22 0916 136/80     Pulse Rate 03/13/22 0916 81     Resp 03/13/22 0916 15     Temp 03/13/22 0916 98.1 F (36.7 C)     Temp Source 03/13/22 0916 Oral     SpO2 03/13/22 0916 96 %     Weight 03/13/22 0914 250 lb (113.4 kg)     Height 03/13/22 0914 6\' 2"  (1.88 m)     Head Circumference --      Peak Flow --      Pain Score 03/13/22 0914 0     Pain Loc --      Pain Edu? --      Excl. in GC? --    No data found.  Updated Vital Signs BP 136/80 (BP Location: Left Arm)  Pulse 81   Temp 98.1 F (36.7 C) (Oral)   Resp 15   Ht 6\' 2"  (1.88 m)   Wt 250 lb (113.4 kg)   SpO2 96%   BMI 32.10 kg/m   Visual Acuity Right Eye Distance:   Left Eye Distance:   Bilateral Distance:    Right Eye Near:   Left Eye Near:    Bilateral Near:     Physical Exam Vitals and nursing note reviewed.  Constitutional:      Appearance: Normal appearance. He is not ill-appearing.  HENT:     Head: Normocephalic and atraumatic.  Skin:    General: Skin is warm and dry.     Capillary Refill: Capillary refill takes less than 2 seconds.     Findings: Erythema and rash present.  Neurological:     General: No focal deficit present.     Mental Status: He is alert and oriented to person, place, and time.  Psychiatric:        Mood and Affect: Mood normal.        Behavior: Behavior normal.        Thought Content: Thought content normal.        Judgment: Judgment normal.      UC Treatments / Results  Labs (all labs ordered are listed, but only abnormal results are displayed) Labs Reviewed   COMPREHENSIVE METABOLIC PANEL - Abnormal; Notable for the following components:      Result Value   Glucose, Bld 102 (*)    Total Protein 8.3 (*)    All other components within normal limits    EKG   Radiology No results found.  Procedures Procedures (including critical care time)  Medications Ordered in UC Medications - No data to display  Initial Impression / Assessment and Plan / UC Course  I have reviewed the triage vital signs and the nursing notes.  Pertinent labs & imaging results that were available during my care of the patient were reviewed by me and considered in my medical decision making (see chart for details).  Patient is a very pleasant, nontoxic-appearing 51 year old male here for evaluation of an itchy red rash on the left side of the scalp and left beard line.  The rash is resulted in some hypopigmentation with a raised border.  There is no flaking to the skin.  This is consistent with tinea capitis.  I will prescribe patient Diflucan for her milligrams daily for 4 weeks for treatment of the tinea capitis.  Prior to discharge I will check a CMP to ensure that he has normal liver function.  Patient CMP shows normal transaminases.  I will discharge patient home on Diflucan 400 mg daily for 4 weeks for treatment of tinea capitis.   Final Clinical Impressions(s) / UC Diagnoses   Final diagnoses:  Tinea capitis     Discharge Instructions      Take the Diflucan once daily for 4 weeks for treatment of your tinea capitis.  Follow-up with your primary care provider if your symptoms not getting better.     ED Prescriptions   None    PDMP not reviewed this encounter.   44, NP 03/13/22 1026

## 2022-03-13 NOTE — Discharge Instructions (Addendum)
Take the Diflucan once daily for 4 weeks for treatment of your tinea capitis.  Follow-up with your primary care provider if your symptoms not getting better.

## 2022-07-06 ENCOUNTER — Ambulatory Visit
Admission: EM | Admit: 2022-07-06 | Discharge: 2022-07-06 | Disposition: A | Discharge status: Home / Self Care | Payer: 59 | Attending: Family Medicine | Admitting: Family Medicine

## 2022-07-06 ENCOUNTER — Encounter: Payer: Self-pay | Admitting: Emergency Medicine

## 2022-07-06 DIAGNOSIS — B350 Tinea barbae and tinea capitis: Secondary | ICD-10-CM | POA: Insufficient documentation

## 2022-07-06 DIAGNOSIS — J40 Bronchitis, not specified as acute or chronic: Secondary | ICD-10-CM

## 2022-07-06 DIAGNOSIS — J329 Chronic sinusitis, unspecified: Secondary | ICD-10-CM | POA: Diagnosis present

## 2022-07-06 LAB — COMPREHENSIVE METABOLIC PANEL
ALT: 18 U/L (ref 0–44)
AST: 21 U/L (ref 15–41)
Albumin: 4.5 g/dL (ref 3.5–5.0)
Alkaline Phosphatase: 79 U/L (ref 38–126)
Anion gap: 7 (ref 5–15)
BUN: 13 mg/dL (ref 6–20)
CO2: 28 mmol/L (ref 22–32)
Calcium: 9.1 mg/dL (ref 8.9–10.3)
Chloride: 102 mmol/L (ref 98–111)
Creatinine, Ser: 1.06 mg/dL (ref 0.61–1.24)
GFR, Estimated: >60 mL/min (ref >60)
Glucose, Bld: 106 mg/dL — ABNORMAL HIGH (ref 70–99)
Potassium: 3.8 mmol/L (ref 3.5–5.1)
Sodium: 137 mmol/L (ref 135–145)
Total Bilirubin: 0.8 mg/dL (ref 0.3–1.2)
Total Protein: 8.4 g/dL — ABNORMAL HIGH (ref 6.5–8.1)

## 2022-07-06 MED ORDER — ALBUTEROL SULFATE (2.5 MG/3ML) 0.083% IN NEBU
2.5000 mg | INHALATION_SOLUTION | Freq: Once | RESPIRATORY_TRACT | Status: AC
  Administered 2022-07-06: 2.5 mg via RESPIRATORY_TRACT

## 2022-07-06 MED ORDER — ALBUTEROL SULFATE HFA 108 (90 BASE) MCG/ACT IN AERS: 2.0000 | INHALATION_SPRAY | RESPIRATORY_TRACT | 0 refills | Status: AC | PRN

## 2022-07-06 MED ORDER — PROMETHAZINE-DM 6.25-15 MG/5ML PO SYRP: 5.0000 mL | ORAL_SOLUTION | Freq: Four times a day (QID) | ORAL | 0 refills | Status: AC | PRN

## 2022-07-06 MED ORDER — TERBINAFINE HCL 250 MG PO TABS: 250.0000 mg | ORAL_TABLET | Freq: Every day | ORAL | 0 refills | Status: AC

## 2022-07-06 MED ORDER — AZITHROMYCIN 250 MG PO TABS: 250.0000 mg | ORAL_TABLET | Freq: Every day | ORAL | 0 refills | Status: AC

## 2022-07-06 MED ORDER — PREDNISONE 20 MG PO TABS: 40.0000 mg | ORAL_TABLET | Freq: Every day | ORAL | 0 refills | Status: AC

## 2022-07-06 NOTE — Discharge Instructions (Addendum)
I believe you may have a bacterial infection in your lungs.  You were given steroids and antibiotics to help with this.  Use the albuterol inhaler every 6 hours while awake give yourself 2 puffs for the next 2 days and then as needed for chest tightness and shortness of breath.  If your cough persist after this please return or follow-up with your primary care provider.  Stop taking the Diflucan for your fungal rash on your head.  I sent a medicine called Lamisil/terbinafine that you have taken in the past without difficulty.  Take 1 tablet daily for the next 6 weeks.

## 2022-07-06 NOTE — ED Triage Notes (Signed)
Pt presents with cough and congestion x 2 weeks. PT has tried OTC medication but has not helped.

## 2022-07-06 NOTE — ED Provider Notes (Signed)
[facility]    CSN: [id number] Arrival date & time: 07/06/22  [id number]      History   Chief Complaint Chief Complaint  Patient presents with   Cough   Nasal Congestion    HPI Kahlel Vannucci is a 51 y.o. male.   HPI   Orvie presents for persistent cough with nasal congestion for the past 2 weeks.  States that he used to have clear sputum and now it is green.  He is having to cough and spit up frequently.  The cough is bothering him at night and he has been not able to sleep.Marland Kitchen  He has felt hot but did not take his temperature.  Reports chills.  Has a headache, sinus pressure and nausea vomiting.  Has chest tightness but no shortness of breath.  Denies sore throat, neck pain, abdominal pain.  He took over-the-counter cold and sinus medications but this did not help.  States he was seen last month for a rash.  States he was given a medication for it but it started making him have chest discomfort so he stopped taking it.  He then tried to cut the tablet in half to see if that will help but that also caused some chest discomfort.  He has not had any chest discomfort since stopping the medication.       Past Medical History:  Diagnosis Date   H/O seasonal allergies     There are no problems to display for this patient.   Past Surgical History:  Procedure Laterality Date   NO PAST SURGERIES         Home Medications    Prior to Admission medications   Medication Sig Start Date End Date Taking? Authorizing Provider  albuterol (VENTOLIN HFA) 108 (90 Base) MCG/ACT inhaler Inhale 2 puffs into the lungs every 4 (four) hours as needed. 07/06/22  Yes Jocelin Schuelke, DO  azithromycin (ZITHROMAX Z-PAK) 250 MG tablet Take 1 tablet (250 mg total) by mouth daily. 07/06/22  Yes Quantae Martel, DO  predniSONE (DELTASONE) 20 MG tablet Take 2 tablets (40 mg total) by mouth daily for 5 days. 07/06/22 07/11/22 Yes Timica Marcom, DO  promethazine-dextromethorphan  (PROMETHAZINE-DM) 6.25-15 MG/5ML syrup Take 5 mLs by mouth 4 (four) times daily as needed. 07/06/22  Yes Triana Coover, DO  terbinafine (LAMISIL) 250 MG tablet Take 1 tablet (250 mg total) by mouth daily. 07/06/22 08/17/22 Yes Lyndee Hensen, DO    Family History Family History  Problem Relation Age of Onset   Hypertension Mother    Cancer Father     Social History Social History   Tobacco Use   Smoking status: Never   Smokeless tobacco: Never  Vaping Use   Vaping Use: Never used  Substance Use Topics   Alcohol use: Yes    Comment: social   Drug use: No     Allergies   Shellfish allergy, Diflucan [fluconazole], and Tuna [fish allergy]   Review of Systems Review of Systems: negative unless otherwise stated in HPI.      Physical Exam Triage Vital Signs ED Triage Vitals  Enc Vitals Group     BP 07/06/22 0846 (!) 141/77     Pulse Rate 07/06/22 0846 84     Resp 07/06/22 0846 18     Temp 07/06/22 0846 98.6 F (37 C)     Temp Source 07/06/22 0846 Oral     SpO2 07/06/22 0846 97 %     Weight --  Height --      Head Circumference --      Peak Flow --      Pain Score 07/06/22 0844 0     Pain Loc --      Pain Edu? --      Excl. in [place]? --    No data found.  Updated Vital Signs BP (!) 141/77 (BP Location: Right Arm)   Pulse 84   Temp 98.6 F (37 C) (Oral)   Resp 18   SpO2 97%   Visual Acuity Right Eye Distance:   Left Eye Distance:   Bilateral Distance:    Right Eye Near:   Left Eye Near:    Bilateral Near:     Physical Exam GEN:     alert, non-toxic appearing male in no distress     HENT:  mucus membranes moist, oropharyngeal  without lesions or  exudate, no  tonsillar hypertrophy,   mild oropharyngeal erythema,   moderate erythematous edematous turbinates,  clear nasal discharge,  bilateral TM  normal, rash as below EYES:   pupils equal and reactive, EOMi,  no scleral injection NECK:  normal ROM, anterior lymphadenopathy,  no meningismus    RESP:  no increased work of breathing, coarse breathe sounds, wheezing, no rales CVS:   regular rate and rhythm Skin:   warm and dry, scaly patch on scalp, lateral scalp adjacent to bilateral ears and left side of his beard.     UC Treatments / Results  Labs (all labs ordered are listed, but only abnormal results are displayed) Labs Reviewed  COMPREHENSIVE METABOLIC PANEL - Abnormal; Notable for the following components:      Result Value   Glucose, Bld 106 (*)    Total Protein 8.4 (*)    All other components within normal limits    EKG   Radiology No results found.  Procedures Procedures (including critical care time)  Medications Ordered in UC Medications  albuterol (PROVENTIL) (2.5 MG/3ML) 0.083% nebulizer solution 2.5 mg (2.5 mg Nebulization Given 07/06/22 0931)    Initial Impression / Assessment and Plan / UC Course  I have reviewed the triage vital signs and the nursing notes.  Pertinent labs & imaging results that were available during my care of the patient were reviewed by me and considered in my medical decision making (see chart for details).       Pt is a 51 y.o. male who presents for 2 weeks of cough and nasal congestion. Jovahn is  afebrile here without recent antipyretics. Satting well on room air. Overall pt is  well appearing, well hydrated, without respiratory distress. Pulmonary exam is remarkable for coarse breathe sounds and wheezing. Having bronchospasms during exam. Given albuterol nebulizer with improvement.  COVID and influenza testing deferred due to duration of symptoms. I suspect bacterial conversion of viral illness. Treat with azithromycin. Given prescriptions for antibiotics, albuterol inhaler, promethazine DM cough syrup and steroid burst.    Pt with recurrent tinea infections. Pt seen in urgent care in July and given Diflucan for tinea capitis. He was seen a few additional times in the past and treated for same.  Exam consistent with tinea  capitis and tinea barbae.  He had terbinafine in the past with resolution of symptoms.  CMP obtained and he has normal LFTs.  Prescription sent to the pharmacy.   Return and ED precautions given and patient voiced understanding. Discussed MDM, treatment plan and plan for follow-up with patient  who agrees with plan.  Final Clinical Impressions(s) / UC Diagnoses   Final diagnoses:  Sinobronchitis  Tinea barbae and tinea capitis     Discharge Instructions      I believe you may have a bacterial infection in your lungs.  You were given steroids and antibiotics to help with this.  Use the albuterol inhaler every 6 hours while awake give yourself 2 puffs for the next 2 days and then as needed for chest tightness and shortness of breath.  If your cough persist after this please return or follow-up with your primary care provider.  Stop taking the Diflucan for your fungal rash on your head.  I sent a medicine called Lamisil/terbinafine that you have taken in the past without difficulty.  Take 1 tablet daily for the next 6 weeks.      ED Prescriptions     Medication Sig Dispense Auth. Provider   albuterol (VENTOLIN HFA) 108 (90 Base) MCG/ACT inhaler Inhale 2 puffs into the lungs every 4 (four) hours as needed. 6.7 g Mikenzi Raysor, DO   predniSONE (DELTASONE) 20 MG tablet Take 2 tablets (40 mg total) by mouth daily for 5 days. 10 tablet Dantonio Justen, DO   azithromycin (ZITHROMAX Z-PAK) 250 MG tablet Take 1 tablet (250 mg total) by mouth daily. 6 tablet Marliyah Reid, DO   promethazine-dextromethorphan (PROMETHAZINE-DM) 6.25-15 MG/5ML syrup Take 5 mLs by mouth 4 (four) times daily as needed. 118 mL [place] Pelto, DO   terbinafine (LAMISIL) 250 MG tablet Take 1 tablet (250 mg total) by mouth daily. 42 tablet Wasim Hurlbut, Ronnette Juniper, DO      PDMP not reviewed this encounter.   Lyndee Hensen, DO 07/06/22 1400

## 2023-01-06 ENCOUNTER — Ambulatory Visit: Payer: 59

## 2023-01-06 DIAGNOSIS — Z1211 Encounter for screening for malignant neoplasm of colon: Secondary | ICD-10-CM | POA: Diagnosis present

## 2023-01-06 DIAGNOSIS — K640 First degree hemorrhoids: Secondary | ICD-10-CM | POA: Diagnosis not present

## 2023-10-14 ENCOUNTER — Encounter: Payer: Self-pay | Admitting: Emergency Medicine

## 2023-10-14 ENCOUNTER — Ambulatory Visit
Admission: EM | Admit: 2023-10-14 | Discharge: 2023-10-14 | Disposition: A | Discharge status: Home / Self Care | Payer: 59 | Attending: Emergency Medicine | Admitting: Emergency Medicine

## 2023-10-14 DIAGNOSIS — J101 Influenza due to other identified influenza virus with other respiratory manifestations: Secondary | ICD-10-CM | POA: Diagnosis present

## 2023-10-14 LAB — RESP PANEL BY RT-PCR (FLU A&B, COVID) ARPGX2
Influenza A by PCR: POSITIVE — AB
Influenza B by PCR: NEGATIVE
SARS Coronavirus 2 by RT PCR: NEGATIVE

## 2023-10-14 NOTE — ED Triage Notes (Signed)
Pt c/o fever, chills, diarrhea, cough, and body aches. Started about 3 days ago.

## 2023-10-14 NOTE — Discharge Instructions (Addendum)
You have influenza A,you are outside the window for treatment(Tamiflu). No antibiotic is indicated at this time, May treat with OTC meds of choice. Make sure to drink plenty of fluids to stay hydrated(gatorade, water, popsicles,jello,etc), avoid caffeine products. Follow up with PCP. Return as needed.  If you develop chest pain, shortness of breath, or worsening symptoms go to emergency room for further evaluation

## 2023-10-14 NOTE — ED Provider Notes (Signed)
[facility]    CSN: [id number] Arrival date & time: 10/14/23  1135      History   Chief Complaint No chief complaint on file.   HPI Luis Cabrera is a 53 y.o. male.   Luis Cabrera, 53 year old male patient, presents to urgent care for evaluation of fever chills diarrhea cough and bodyaches for 3 days.  The history is provided by the patient. No language interpreter was used.    Past Medical History:  Diagnosis Date   H/O seasonal allergies     There are no active problems to display for this patient.   Past Surgical History:  Procedure Laterality Date   NO PAST SURGERIES         Home Medications    Prior to Admission medications   Medication Sig Start Date End Date Taking? Authorizing Provider  albuterol (VENTOLIN HFA) 108 (90 Base) MCG/ACT inhaler Inhale 2 puffs into the lungs every 4 (four) hours as needed. 07/06/22   Brimage, Seward Meth, DO  azithromycin (ZITHROMAX Z-PAK) 250 MG tablet Take 1 tablet (250 mg total) by mouth daily. 07/06/22   Katha Cabal, DO  promethazine-dextromethorphan (PROMETHAZINE-DM) 6.25-15 MG/5ML syrup Take 5 mLs by mouth 4 (four) times daily as needed. 07/06/22   Katha Cabal, DO    Family History Family History  Problem Relation Age of Onset   Hypertension Mother    Cancer Father     Social History Social History   Tobacco Use   Smoking status: Never   Smokeless tobacco: Never  Vaping Use   Vaping status: Never Used  Substance Use Topics   Alcohol use: Yes    Comment: social   Drug use: No     Allergies   Shellfish allergy, Diflucan [fluconazole], and Tuna [fish allergy]   Review of Systems Review of Systems  Constitutional:  Positive for chills and fever.  Respiratory:  Positive for cough.   Gastrointestinal:  Positive for diarrhea.  Musculoskeletal:  Positive for myalgias.  All other systems reviewed and are negative.    Physical Exam Triage Vital Signs ED Triage Vitals   Encounter Vitals Group     BP      Systolic BP Percentile      Diastolic BP Percentile      Pulse      Resp      Temp      Temp src      SpO2      Weight      Height      Head Circumference      Peak Flow      Pain Score      Pain Loc      Pain Education      Exclude from Growth Chart    No data found.  Updated Vital Signs There were no vitals taken for this visit.  Visual Acuity Right Eye Distance:   Left Eye Distance:   Bilateral Distance:    Right Eye Near:   Left Eye Near:    Bilateral Near:     Physical Exam Vitals and nursing note reviewed.  Constitutional:      General: He is not in acute distress.    Appearance: He is well-developed. He is not ill-appearing or toxic-appearing.  HENT:     Head: Normocephalic.     Right Ear: Tympanic membrane is retracted.     Left Ear: Tympanic membrane is retracted.     Nose: Mucosal edema and congestion present.  Mouth/Throat:     Mouth: Mucous membranes are moist.     Pharynx: Uvula midline.  Eyes:     General: Lids are normal.     Conjunctiva/sclera: Conjunctivae normal.     Pupils: Pupils are equal, round, and reactive to light.  Cardiovascular:     Rate and Rhythm: Normal rate and regular rhythm.     Heart sounds: Normal heart sounds.  Pulmonary:     Effort: Pulmonary effort is normal. No respiratory distress.     Breath sounds: Normal breath sounds. No decreased breath sounds or wheezing.  Abdominal:     General: There is no distension.     Palpations: Abdomen is soft.  Musculoskeletal:        General: Normal range of motion.     Cervical back: Normal range of motion.  Skin:    General: Skin is warm and dry.     Findings: No rash.  Neurological:     General: No focal deficit present.     Mental Status: He is alert and oriented to person, place, and time.     GCS: GCS eye subscore is 4. GCS verbal subscore is 5. GCS motor subscore is 6.     Cranial Nerves: No cranial nerve deficit.     Sensory: No  sensory deficit.  Psychiatric:        Speech: Speech normal.        Behavior: Behavior normal. Behavior is cooperative.      UC Treatments / Results  Labs (all labs ordered are listed, but only abnormal results are displayed) Labs Reviewed  RESP PANEL BY RT-PCR (FLU A&B, COVID) [id number]    EKG   Radiology No results found.  Procedures Procedures (including critical care time)  Medications Ordered in UC Medications - No data to display  Initial Impression / Assessment and Plan / UC Course  I have reviewed the triage vital signs and the nursing notes.  Pertinent labs & imaging results that were available during my care of the patient were reviewed by me and considered in my medical decision making (see chart for details).  Clinical Course as of 10/14/23 1300  Fri Oct 14, 2023  1250 Influenza A [JD]    Clinical Course User Index [JD] Luis Cabrera, Para March, NP    *** Final Clinical Impressions(s) / UC Diagnoses   Final diagnoses:  None   Discharge Instructions   None    ED Prescriptions   None    PDMP not reviewed this encounter.
